# Patient Record
Sex: Female | Born: 1985 | Race: White | Hispanic: No | Marital: Married | Smoking: Never smoker
Health system: Southern US, Community
[De-identification: ages and names within clinical notes are randomized; demographics above are authoritative.]

## PROBLEM LIST (undated history)

## (undated) DIAGNOSIS — F419 Anxiety disorder, unspecified: Secondary | ICD-10-CM

## (undated) DIAGNOSIS — F988 Other specified behavioral and emotional disorders with onset usually occurring in childhood and adolescence: Secondary | ICD-10-CM

---

## 2020-05-09 ENCOUNTER — Emergency Department (HOSPITAL_COMMUNITY): Payer: Medicaid Other

## 2020-05-09 ENCOUNTER — Encounter (HOSPITAL_COMMUNITY): Payer: Self-pay

## 2020-05-09 ENCOUNTER — Other Ambulatory Visit: Payer: Self-pay

## 2020-05-09 ENCOUNTER — Emergency Department (HOSPITAL_COMMUNITY)
Admission: EM | Admit: 2020-05-09 | Discharge: 2020-05-09 | Disposition: A | Discharge status: Home / Self Care | Payer: Medicaid Other | Attending: Emergency Medicine | Admitting: Emergency Medicine

## 2020-05-09 DIAGNOSIS — R0789 Other chest pain: Secondary | ICD-10-CM | POA: Diagnosis not present

## 2020-05-09 DIAGNOSIS — R0602 Shortness of breath: Secondary | ICD-10-CM | POA: Insufficient documentation

## 2020-05-09 DIAGNOSIS — Z79899 Other long term (current) drug therapy: Secondary | ICD-10-CM | POA: Diagnosis not present

## 2020-05-09 DIAGNOSIS — R079 Chest pain, unspecified: Secondary | ICD-10-CM

## 2020-05-09 LAB — D-DIMER, QUANTITATIVE: D-Dimer, Quant: 0.52 ug/mL-FEU — ABNORMAL HIGH (ref 0.00–0.50)

## 2020-05-09 LAB — CBC
HCT: 43.8 % (ref 36.0–46.0)
Hemoglobin: 14.2 g/dL (ref 12.0–15.0)
MCH: 29.5 pg (ref 26.0–34.0)
MCHC: 32.4 g/dL (ref 30.0–36.0)
MCV: 91.1 fL (ref 80.0–100.0)
Platelets: 333 10*3/uL (ref 150–400)
RBC: 4.81 MIL/uL (ref 3.87–5.11)
RDW: 12.3 % (ref 11.5–15.5)
WBC: 9.5 10*3/uL (ref 4.0–10.5)
nRBC: 0 % (ref 0.0–0.2)

## 2020-05-09 LAB — BASIC METABOLIC PANEL
Anion gap: 8 (ref 5–15)
BUN: 12 mg/dL (ref 6–20)
CO2: 24 mmol/L (ref 22–32)
Calcium: 9 mg/dL (ref 8.9–10.3)
Chloride: 105 mmol/L (ref 98–111)
Creatinine, Ser: 0.85 mg/dL (ref 0.44–1.00)
GFR calc Af Amer: >60 mL/min (ref >60)
GFR calc non Af Amer: >60 mL/min (ref >60)
Glucose, Bld: 153 mg/dL — ABNORMAL HIGH (ref 70–99)
Potassium: 4 mmol/L (ref 3.5–5.1)
Sodium: 137 mmol/L (ref 135–145)

## 2020-05-09 LAB — I-STAT BETA HCG BLOOD, ED (MC, WL, AP ONLY): I-stat hCG, quantitative: <5 m[IU]/mL (ref <5)

## 2020-05-09 LAB — TROPONIN I (HIGH SENSITIVITY)
Troponin I (High Sensitivity): <2 ng/L (ref <18)
Troponin I (High Sensitivity): 2 ng/L (ref <18)

## 2020-05-09 MED ORDER — ALBUTEROL SULFATE HFA 108 (90 BASE) MCG/ACT IN AERS
2.0000 | INHALATION_SPRAY | Freq: Once | RESPIRATORY_TRACT | Status: AC
  Administered 2020-05-09: 2 via RESPIRATORY_TRACT
  Filled 2020-05-09: qty 6.7

## 2020-05-09 MED ORDER — SODIUM CHLORIDE 0.9% FLUSH: 3.0000 mL | Freq: Once | INTRAVENOUS | Status: DC

## 2020-05-09 MED ORDER — IOHEXOL 350 MG/ML SOLN
100.0000 mL | Freq: Once | INTRAVENOUS | Status: AC | PRN
  Administered 2020-05-09: 54 mL via INTRAVENOUS

## 2020-05-09 MED ORDER — LORAZEPAM 1 MG PO TABS
1.0000 mg | ORAL_TABLET | Freq: Once | ORAL | Status: AC
  Administered 2020-05-09: 1 mg via ORAL
  Filled 2020-05-09: qty 1

## 2020-05-09 MED ORDER — AEROCHAMBER PLUS FLO-VU LARGE MISC
1.0000 | Freq: Once | Status: AC
  Administered 2020-05-09: 1

## 2020-05-09 NOTE — Discharge Instructions (Addendum)
Your work-up today has been reassuring, please continue to use albuterol and other medications prescribed by your primary care doctor as needed for shortness of breath.  Please call to schedule follow-up with your lung doctor to schedule pulmonary function test.  I would also like you to call to schedule follow-up with your cardiologist so they can see if they think there is any heart problem contributing to your symptoms.

## 2020-05-09 NOTE — ED Notes (Signed)
Pt transported to CT ?

## 2020-05-09 NOTE — ED Notes (Signed)
Patient is back in bed on the monitor with family at bedside and call bell in reach  

## 2020-05-09 NOTE — ED Triage Notes (Addendum)
Pt arrives to ED w/ c/o chest pain, sob x 1 month. Pt endorses sob on exertion. Pt also states she has developed a rash over the last week.

## 2020-05-09 NOTE — ED Notes (Signed)
RT at bedside.

## 2020-05-09 NOTE — ED Provider Notes (Signed)
[facility] EMERGENCY DEPARTMENT Provider Note   CSN: [id number] Arrival date & time: 05/09/20  1205     History Chief Complaint  Patient presents with  . Chest Pain    Flora Parks is a 34 y.o. female.  Zemirah Krasinski Oplinger is a 34 y.o. female with a history of ADHD, generalized anxiety disorder, hyperlipidemia, GERD, DVT, scoliosis, who presents to the emergency department for evaluation of shortness of breath and chest pain.   Patient reports she has been dealing with shortness of breath for a little over 1 month, and has seen her PCP multiple times regarding this, but they have not been able to find a cause.  She reports over the past week her symptoms have been getting worse she describes associated central chest pain that gets worse when her breathing is worse.  She states shortness of breath is worse with exertion and has been limiting her activity and ability to go to work.  She also reports a dry intermittent nonproductive cough.  No fevers or chills.  No lower extremity swelling.  She does have a history of DVT.  States her PCP and a CT scan to evaluate for blood clot in her lung when symptoms first began which was negative, but she now reports that shortness of breath and chest pain are worsening.  Her PCP felt that this may be related to reactive airway disease and starting her on an inhaler, but she feels that she does not know how to use correctly and is not getting much benefit from it.  She has been seen by a pulmonologist, but they have not done any testing yet, she states they were supposed to do pulmonary function test but it has been taking a long time to get the schedule.  Patient reports that she is extremely frustrated and just wants to know why she is feeling so short of breath.  Denies any lower extremity swelling.  No personal history of CAD or heart failure.  She does report that she has had issues with an irregular  heartbeat ever since she was a young child, that she seen a cardiologist for.  Reports very occasional cigarette use, none since shortness of breath began.     History reviewed. No pertinent past medical history.  There are no problems to display for this patient.   History reviewed. No pertinent surgical history.   OB History   No obstetric history on file.     No family history on file.  Social History   Tobacco Use  . Smoking status: Not on file  Substance Use Topics  . Alcohol use: Not on file  . Drug use: Not on file    Home Medications Prior to Admission medications   Medication Sig Start Date End Date Taking? Authorizing Provider  albuterol (ACCUNEB) 1.25 MG/3ML nebulizer solution Take 1 ampule by nebulization every 6 (six) hours as needed for wheezing or shortness of breath.  01/17/20  Yes [provider]  albuterol (VENTOLIN HFA) 108 (90 Base) MCG/ACT inhaler Inhale 2 puffs into the lungs every 4 (four) hours as needed for wheezing or shortness of breath.  01/13/20  Yes [provider]  baclofen (LIORESAL) 10 MG tablet Take 10 mg by mouth 3 (three) times daily as needed for muscle spasms. 01/23/20  Yes [provider]  Cholecalciferol 50 MCG (2000 UT) TABS Take 2,000 Units by mouth daily. 08/27/19 08/26/20 Yes [provider]  Dexlansoprazole 30 MG capsule Take 30  capsules by mouth in the morning and at bedtime. 02/04/20 02/03/21 Yes [provider]  diphenhydrAMINE (BENADRYL) 25 mg capsule Take 25 mg by mouth at bedtime as needed for itching.   Yes [provider]  lisdexamfetamine (VYVANSE) 40 MG capsule Take 40 mg by mouth daily. 04/01/20 05/02/21 Yes [provider]  loratadine (CLARITIN) 10 MG tablet Take 10 mg by mouth daily.   Yes [provider]    Allergies    Hydroxyzine, Hydrochlorothiazide, Topiramate, Azithromycin, Atomoxetine, Other, and Pantoprazole  Review of Systems   Review of Systems    Constitutional: Negative for chills and fever.  HENT: Negative.   Respiratory: Positive for cough and shortness of breath. Negative for chest tightness.   Cardiovascular: Positive for chest pain and palpitations (Chronic, unchanged). Negative for leg swelling.  Gastrointestinal: Negative for abdominal pain, nausea and vomiting.  Musculoskeletal: Negative for arthralgias and myalgias.  Skin: Negative for color change.  Neurological: Negative for dizziness, syncope and light-headedness.  Psychiatric/Behavioral: The patient is nervous/anxious.   All other systems reviewed and are negative.   Physical Exam Updated Vital Signs BP (!) 132/93   Pulse (!) 103   Temp 97.7 F (36.5 C) (Oral)   Resp 19   Ht 5\' 3"  (1.6 m)   Wt 70.8 kg   SpO2 96%   BMI 27.63 kg/m   Physical Exam Vitals and nursing note reviewed.  Constitutional:      General: She is not in acute distress.    Appearance: She is well-developed. She is not diaphoretic.     Comments: Appears anxious, but is well-appearing and in no distress  HENT:     Head: Normocephalic and atraumatic.  Eyes:     General:        Right eye: No discharge.        Left eye: No discharge.     Pupils: Pupils are equal, round, and reactive to light.  Cardiovascular:     Rate and Rhythm: Normal rate and regular rhythm.     Pulses:          Radial pulses are 2+ on the right side and 2+ on the left side.     Heart sounds: Normal heart sounds. No murmur heard.  No friction rub. No gallop.      Comments: Intermittent mild tachycardia with regular rhythm Pulmonary:     Effort: Pulmonary effort is normal. No respiratory distress.     Breath sounds: Normal breath sounds. No wheezing or rales.     Comments: Respirations equal and unlabored, patient able to speak in full sentences, lungs clear to auscultation bilaterally Chest:     Chest wall: No tenderness.  Abdominal:     General: Bowel sounds are normal. There is no distension.      Palpations: Abdomen is soft. There is no mass.     Tenderness: There is no abdominal tenderness. There is no guarding.     Comments: Abdomen soft, nondistended, nontender to palpation in all quadrants without guarding or peritoneal signs  Musculoskeletal:        General: No deformity.     Cervical back: Neck supple.     Right lower leg: No edema.     Left lower leg: No edema.  Skin:    General: Skin is warm and dry.     Capillary Refill: Capillary refill takes less than 2 seconds.  Neurological:     Mental Status: She is alert.     Coordination: Coordination  normal.     Comments: Speech is clear, able to follow commands Moves extremities without ataxia, coordination intact  Psychiatric:        Mood and Affect: Mood is anxious.     ED Results / Procedures / Treatments   Labs (all labs ordered are listed, but only abnormal results are displayed) Labs Reviewed  BASIC METABOLIC PANEL - Abnormal; Notable for the following components:      Result Value   Glucose, Bld 153 (*)    All other components within normal limits  D-DIMER, QUANTITATIVE (NOT AT [facility]) - Abnormal; Notable for the following components:   D-Dimer, Quant 0.52 (*)    All other components within normal limits  CBC  I-STAT BETA HCG BLOOD, ED ([facility], [facility], [facility] ONLY)  TROPONIN I (HIGH SENSITIVITY)  TROPONIN I (HIGH SENSITIVITY)    EKG EKG Interpretation  Date/Time:  Saturday May 09 2020 12:11:34 EDT Ventricular Rate:  94 PR Interval:  124 QRS Duration: 68 QT Interval:  318 QTC Calculation: 397 R Axis:   56 Text Interpretation: Normal sinus rhythm with sinus arrhythmia Normal ECG No acute changes No old tracing to compare Confirmed by Derwood Kaplananavati, Ankit [id number]) on 05/09/2020 2:47:37 PM   Radiology DG Chest 2 View  Result Date: 05/09/2020 CLINICAL DATA:  Chest pain and shortness of breath EXAM: CHEST - 2 VIEW COMPARISON:  None. FINDINGS: Lungs are clear. Heart size and pulmonary vascularity are normal. No adenopathy.  There is midthoracic dextroscoliosis. No pneumothorax. IMPRESSION: Lungs clear.  Cardiac silhouette normal. Electronically Signed   By: Bretta BangWilliam  Woodruff III M.D.   On: 05/09/2020 13:38   CT Angio Chest PE W and/or Wo Contrast  Result Date: 05/09/2020 CLINICAL DATA:  Chest pain and shortness of breath for the past month. Elevated D-dimer. EXAM: CT ANGIOGRAPHY CHEST WITH CONTRAST TECHNIQUE: Multidetector CT imaging of the chest was performed using the standard protocol during bolus administration of intravenous contrast. Multiplanar CT image reconstructions and MIPs were obtained to evaluate the vascular anatomy. CONTRAST:  54mL OMNIPAQUE IOHEXOL 350 MG/ML SOLN COMPARISON:  None. FINDINGS: Cardiovascular: Satisfactory opacification of the pulmonary arteries to the segmental level. No evidence of pulmonary embolism. Normal heart size. No pericardial effusion. No thoracic aortic aneurysm or dissection. Mediastinum/Nodes: No enlarged mediastinal, hilar, or axillary lymph nodes. Thyroid gland, trachea, and esophagus demonstrate no significant findings. Lungs/Pleura: Lungs are clear. No pleural effusion or pneumothorax. Upper Abdomen: No acute abnormality. Musculoskeletal: No chest wall abnormality. No acute or significant osseous findings. Review of the MIP images confirms the above findings. IMPRESSION: 1. Normal CTA of the chest. No evidence of pulmonary embolism. Electronically Signed   By: Obie DredgeWilliam T Derry M.D.   On: 05/09/2020 17:29    Procedures Procedures (including critical care time)  Medications Ordered in ED Medications  sodium chloride flush (NS) 0.9 % injection 3 mL (has no administration in time range)  albuterol (VENTOLIN HFA) 108 (90 Base) MCG/ACT inhaler 2 puff (2 puffs Inhalation Given 05/09/20 1429)  AeroChamber Plus Flo-Vu Large MISC 1 each (1 each Other Given 05/09/20 1446)  LORazepam (ATIVAN) tablet 1 mg (1 mg Oral Given 05/09/20 1444)  iohexol (OMNIPAQUE) 350 MG/ML injection 100 mL (54  mLs Intravenous Contrast Given 05/09/20 1713)    ED Course  I have reviewed the triage vital signs and the nursing notes.  Pertinent labs & imaging results that were available during my care of the patient were reviewed by me and considered in my medical decision making (see chart for details).  MDM Rules/Calculators/A&P                         34 year old female presents for evaluation of shortness of breath, symptoms have been present for a little over 1 month, she has seen her PCP multiple times regarding this, her.  Patient and previous notes from PCP for further information.  There is concerned that she may have underlying reactive airway disease, she has been seen by pulmonologist once but has not had any further testing done with them, had negative CTA when symptoms had just began.  She reports that over the past week her breathing has worsened and she is having central chest pain that seems to be worse as shortness of breath worsens.  Shortness of breath is starting to impact her ability to go to work.  She has been trying to use inhaler but feels that she is not using it correctly and not getting much benefit.  Chart review also notes a history patient has not been medications correctly she does not feel that they help symptoms.  We will further evaluate with labs, EKG, chest x-ray, troponin, given worsening symptoms and tachycardia on arrival will repeat D-dimer to reevaluate for PE.  Patient currently feeling short of breath, but maintaining normal O2 sats, will give albuterol and have prepared to provide education and will also try a small dose of Ativan.  Patient wanting to have pulmonary function test done but had long discussion with patient and husband that these are not able to be done through the emergency department.  I have independently ordered, reviewed and interpreted all labs and imaging: EKG - Normal sinus w/o arrhythmia or ischemic changes CXR - No active cardiopulmonary  disease CBC - no leukocytosis, Hgb WNL BMP - No acute electrolyte derangements, normal renal function Preg - neg Trop - neg x 2 D-dimer - 0.52, slightly elevated, will proceed with CTA  CTA without evidence of PE or other acute abnormalities.  Pt ambulated in department and maintained O2 sats of 97-100%, did complain of feeling SOB.  Had long discussion with patient and her husband regarding work-up today.  She is frustrated that she has been dealing with the shortness of breath for more than a month now without being able to identify a cause.  We discussed the potential causes that we have ruled out today, as well as continued symptomatic treatment with bronchodilators, patient provided education from respiratory therapy on use with spacer.  I have also encouraged the patient to try and take her anxiety medication regularly to help eliminate this variable as a potential cause for her symptoms.  I have encouraged her to call to schedule follow-up with the pulmonologist she seen so that she can have pulmonary function test performed.  At this time there does not appear to be any evidence of an acute emergency medical condition and the patient appears stable for discharge with appropriate outpatient follow up.Diagnosis was discussed with patient who verbalizes understanding and is agreeable to discharge.  BP (!) 132/93   Pulse 97   Temp 97.8 F (36.6 C) (Oral)   Resp 19   Ht 5\' 3"  (1.6 m)   Wt 70.8 kg   SpO2 96%   BMI 27.63 kg/m   Portions of this note were generated with Lobby[employer]. Dictation errors may occur despite best attempts at proofreading.   Final Clinical Impression(s) / ED Diagnoses Final diagnoses:  Shortness of breath  Central chest pain  Rx / DC Orders ED Discharge Orders    None       Legrand Rams 05/11/20 Felicita Gage, MD 05/12/20 [phone]

## 2020-05-09 NOTE — Progress Notes (Signed)
RT note: RT presented asthma education and proper use of MDI and HHN devices. RT used teach back method with patient and family member demonstrated understanding and proper use.

## 2020-05-09 NOTE — ED Notes (Signed)
Walked patient to the bathroom patient did well 

## 2020-05-25 ENCOUNTER — Other Ambulatory Visit: Payer: Self-pay

## 2020-05-25 ENCOUNTER — Ambulatory Visit (HOSPITAL_COMMUNITY)
Admission: EM | Admit: 2020-05-25 | Discharge: 2020-05-25 | Disposition: A | Discharge status: Home / Self Care | Payer: Medicaid Other

## 2020-05-25 NOTE — ED Notes (Signed)
Charlene Hill, patient access reports patient left, patient complained to her about wait time

## 2020-09-06 ENCOUNTER — Ambulatory Visit
Admission: EM | Admit: 2020-09-06 | Discharge: 2020-09-06 | Disposition: A | Discharge status: Home / Self Care | Payer: Medicaid Other | Attending: Emergency Medicine | Admitting: Emergency Medicine

## 2020-09-06 ENCOUNTER — Encounter: Payer: Self-pay | Admitting: Emergency Medicine

## 2020-09-06 ENCOUNTER — Other Ambulatory Visit: Payer: Self-pay

## 2020-09-06 DIAGNOSIS — N898 Other specified noninflammatory disorders of vagina: Secondary | ICD-10-CM | POA: Diagnosis not present

## 2020-09-06 DIAGNOSIS — H9193 Unspecified hearing loss, bilateral: Secondary | ICD-10-CM | POA: Diagnosis not present

## 2020-09-06 DIAGNOSIS — R300 Dysuria: Secondary | ICD-10-CM | POA: Diagnosis not present

## 2020-09-06 HISTORY — DX: Anxiety disorder, unspecified: F41.9

## 2020-09-06 HISTORY — DX: Other specified behavioral and emotional disorders with onset usually occurring in childhood and adolescence: F98.8

## 2020-09-06 LAB — POCT URINALYSIS DIP (MANUAL ENTRY)
Glucose, UA: NEGATIVE mg/dL
Ketones, POC UA: NEGATIVE mg/dL
Leukocytes, UA: NEGATIVE
Nitrite, UA: NEGATIVE
Protein Ur, POC: NEGATIVE mg/dL
Spec Grav, UA: >=1.03 — AB (ref 1.010–1.025)
Urobilinogen, UA: 0.2 E.U./dL
pH, UA: 6 (ref 5.0–8.0)

## 2020-09-06 MED ORDER — FEXOFENADINE HCL 60 MG PO TABS: 60.0000 mg | ORAL_TABLET | Freq: Two times a day (BID) | ORAL | 0 refills | Status: AC

## 2020-09-06 MED ORDER — FLUTICASONE PROPIONATE 50 MCG/ACT NA SUSP
1.0000 | Freq: Every day | NASAL | 0 refills | Status: AC
Start: 2020-09-06 — End: ?

## 2020-09-06 MED ORDER — FLUCONAZOLE 150 MG PO TABS: 150.0000 mg | ORAL_TABLET | Freq: Every day | ORAL | 0 refills | Status: AC

## 2020-09-06 NOTE — Discharge Instructions (Addendum)
Diflucan once today, 2nd dose in 3 days. Allergy tablet and flonase daily. Keep appointment with PCP.

## 2020-09-06 NOTE — ED Triage Notes (Signed)
Pt sts ear fullness x weeks since having covid; pt also sts dysuria and some vaginal discharge; pt hasnt been on any meds due to getting new PCP

## 2020-09-06 NOTE — ED Provider Notes (Signed)
[facility]    CSN: [id number] Arrival date & time: 09/06/20  1008      History   Chief Complaint Chief Complaint  Patient presents with  . Ear Fullness  . Dysuria    HPI Charlene Hill is a 34 y.o. female  Presenting for bilateral ear fullness with muffled hearing since having Covid approximately 1 month ago.  Patient denies tinnitus, dizziness, fever.  Does have history of allergies, though is not taking thing for this.  Denies nasal congestion or pressure, sore throat. Patient also concern for yeast infection as she has had some discomfort urinating with increased vaginal discharge.  Denies vaginal or pelvic pain, concern for STI.  No anogenital rash.  Denies urinary frequency, urgency, back pain or abdominal pain.  Past Medical History:  Diagnosis Date  . ADD (attention deficit disorder)   . Anxiety     There are no problems to display for this patient.   History reviewed. No pertinent surgical history.  OB History   No obstetric history on file.      Home Medications    Prior to Admission medications   Medication Sig Start Date End Date Taking? Authorizing Provider  albuterol (ACCUNEB) 1.25 MG/3ML nebulizer solution Take 1 ampule by nebulization every 6 (six) hours as needed for wheezing or shortness of breath.  01/17/20   [provider]  albuterol (VENTOLIN HFA) 108 (90 Base) MCG/ACT inhaler Inhale 2 puffs into the lungs every 4 (four) hours as needed for wheezing or shortness of breath.  01/13/20   [provider]  baclofen (LIORESAL) 10 MG tablet Take 10 mg by mouth 3 (three) times daily as needed for muscle spasms. Patient not taking: Reported on 09/06/2020 01/23/20   [provider]  Dexlansoprazole 30 MG capsule Take 30 capsules by mouth in the morning and at bedtime. 02/04/20 02/03/21  [provider]  diphenhydrAMINE (BENADRYL) 25 mg capsule Take 25 mg by mouth at bedtime as needed for itching.     [provider]  fexofenadine (ALLEGRA) 60 MG tablet Take 1 tablet (60 mg total) by mouth 2 (two) times daily. 09/06/20 10/06/20  Hall-Potvin, [place], PA-C  fluconazole (DIFLUCAN) 150 MG tablet Take 1 tablet (150 mg total) by mouth daily. May repeat in 72 hours if needed 09/06/20   Hall-Potvin, [place], PA-C  fluticasone (FLONASE) 50 MCG/ACT nasal spray Place 1 spray into both nostrils daily. 09/06/20   Hall-Potvin, [place], PA-C  lisdexamfetamine (VYVANSE) 40 MG capsule Take 40 mg by mouth daily. Patient not taking: Reported on 09/06/2020 04/01/20 05/02/21  [provider]  loratadine (CLARITIN) 10 MG tablet Take 10 mg by mouth daily.    [provider]    Family History Family History  Problem Relation Age of Onset  . Diabetes Mother   . Hypertension Mother   . Heart failure Mother     Social History Social History   Tobacco Use  . Smoking status: Never Smoker  . Smokeless tobacco: Never Used  Substance Use Topics  . Alcohol use: Not Currently  . Drug use: Never     Allergies   Hydroxyzine, Hydrochlorothiazide, Topiramate, Azithromycin, Atomoxetine, Other, and Pantoprazole   Review of Systems As per HPI   Physical Exam Triage Vital Signs ED Triage Vitals  Enc Vitals Group     BP      Pulse      Resp      Temp      Temp src  SpO2      Weight      Height      Head Circumference      Peak Flow      Pain Score      Pain Loc      Pain Edu?      Excl. in GC?    No data found.  Updated Vital Signs BP 129/88 (BP Location: Left Arm)   Pulse 92   Temp 98.5 F (36.9 C) (Oral)   Resp 18   SpO2 99%   Visual Acuity Right Eye Distance:   Left Eye Distance:   Bilateral Distance:    Right Eye Near:   Left Eye Near:    Bilateral Near:     Physical Exam Constitutional:      General: She is not in acute distress.    Appearance: She is not ill-appearing.  HENT:     Head: Normocephalic and atraumatic.     Right Ear: Tympanic  membrane and ear canal normal.     Left Ear: Tympanic membrane and ear canal normal.  Eyes:     General: No scleral icterus.    Pupils: Pupils are equal, round, and reactive to light.  Cardiovascular:     Rate and Rhythm: Normal rate.  Pulmonary:     Effort: Pulmonary effort is normal.  Skin:    Coloration: Skin is not jaundiced or pale.  Neurological:     Mental Status: She is alert and oriented to person, place, and time.      UC Treatments / Results  Labs (all labs ordered are listed, but only abnormal results are displayed) Labs Reviewed  POCT URINALYSIS DIP (MANUAL ENTRY) - Abnormal; Notable for the following components:      Result Value   Clarity, UA hazy (*)    Bilirubin, UA small (*)    Spec Grav, UA >=1.030 (*)    Blood, UA moderate (*)    All other components within normal limits    EKG   Radiology No results found.  Procedures Procedures (including critical care time)  Medications Ordered in UC Medications - No data to display  Initial Impression / Assessment and Plan / UC Course  I have reviewed the triage vital signs and the nursing notes.  Pertinent labs & imaging results that were available during my care of the patient were reviewed by me and considered in my medical decision making (see chart for details).     Urine with moderate blood, elevated specific gravity.  Low concern for UTI at this time.  Will follow up with PCP in that regard as needed.  Will trial antihistamines, intranasal steroid for possible eustachian tube dysfunction.  No alarm signs/symptoms as in HPI, PE.  Provided ENT contact information for further evaluation as needed.  Return precautions discussed, pt verbalized understanding and is agreeable to plan. Final Clinical Impressions(s) / UC Diagnoses   Final diagnoses:  Dysuria  Vaginal discharge  Decreased hearing of both ears     Discharge Instructions     Diflucan once today, 2nd dose in 3 days. Allergy tablet and  flonase daily. Keep appointment with PCP.    ED Prescriptions    Medication Sig Dispense Auth. Provider   fluticasone (FLONASE) 50 MCG/ACT nasal spray Place 1 spray into both nostrils daily. 16 g Hall-Potvin, [place], PA-C   fexofenadine (ALLEGRA) 60 MG tablet Take 1 tablet (60 mg total) by mouth 2 (two) times daily. 60 tablet Hall-Potvin, [place], [place]  fluconazole (DIFLUCAN) 150 MG tablet Take 1 tablet (150 mg total) by mouth daily. May repeat in 72 hours if needed 2 tablet Hall-Potvin, [place], PA-C     PDMP not reviewed this encounter.   Hall-Potvin, [place], [place] 09/06/20 1124

## 2021-01-26 ENCOUNTER — Other Ambulatory Visit: Payer: Self-pay

## 2021-01-26 ENCOUNTER — Ambulatory Visit
Admission: EM | Admit: 2021-01-26 | Discharge: 2021-01-26 | Disposition: A | Discharge status: Home / Self Care | Payer: Medicaid Other | Attending: Urgent Care | Admitting: Urgent Care

## 2021-01-26 ENCOUNTER — Encounter: Payer: Self-pay | Admitting: Emergency Medicine

## 2021-01-26 DIAGNOSIS — R42 Dizziness and giddiness: Secondary | ICD-10-CM

## 2021-01-26 DIAGNOSIS — R350 Frequency of micturition: Secondary | ICD-10-CM | POA: Diagnosis present

## 2021-01-26 DIAGNOSIS — R3915 Urgency of urination: Secondary | ICD-10-CM

## 2021-01-26 DIAGNOSIS — R0981 Nasal congestion: Secondary | ICD-10-CM | POA: Diagnosis present

## 2021-01-26 DIAGNOSIS — R059 Cough, unspecified: Secondary | ICD-10-CM

## 2021-01-26 LAB — POCT URINALYSIS DIP (MANUAL ENTRY)
Bilirubin, UA: NEGATIVE
Blood, UA: NEGATIVE
Glucose, UA: NEGATIVE mg/dL
Ketones, POC UA: NEGATIVE mg/dL
Leukocytes, UA: NEGATIVE
Nitrite, UA: NEGATIVE
Protein Ur, POC: NEGATIVE mg/dL
Spec Grav, UA: 1.01 (ref 1.010–1.025)
Urobilinogen, UA: 0.2 E.U./dL
pH, UA: 7 (ref 5.0–8.0)

## 2021-01-26 MED ORDER — BENZONATATE 100 MG PO CAPS: 100.0000 mg | ORAL_CAPSULE | Freq: Three times a day (TID) | ORAL | 0 refills | Status: AC | PRN

## 2021-01-26 MED ORDER — PROMETHAZINE-DM 6.25-15 MG/5ML PO SYRP: 5.0000 mL | ORAL_SOLUTION | Freq: Every evening | ORAL | 0 refills | Status: AC | PRN

## 2021-01-26 NOTE — ED Triage Notes (Signed)
Pt said x 3days she has been having some congestion causing cough, with dizziness. Pt also said possible yeast, possible UTI. Burning with urination and urinary frequency.

## 2021-01-26 NOTE — ED Provider Notes (Signed)
[facility]   MRN: [id number] DOB: 16-Feb-1986  Subjective:   Charlene Hill is a 35 y.o. female presenting for 3-day history of acute onset recurrent sinus congestion, cough.  Patient has concerns about a sinus infection or lung infection.  She also has had some intermittent dizziness.  Is worried about a yeast infection or UTI she is having dysuria, urinary frequency.  Patient admits that she does not drink water, drinks coffee and soda.  She has had COVID previously and states that this feels very different and does not want to test today.  No current facility-administered medications for this encounter.  Current Outpatient Medications:  .  albuterol (ACCUNEB) 1.25 MG/3ML nebulizer solution, Take 1 ampule by nebulization every 6 (six) hours as needed for wheezing or shortness of breath. , Disp: , Rfl:  .  albuterol (VENTOLIN HFA) 108 (90 Base) MCG/ACT inhaler, Inhale 2 puffs into the lungs every 4 (four) hours as needed for wheezing or shortness of breath. , Disp: , Rfl:  .  baclofen (LIORESAL) 10 MG tablet, Take 10 mg by mouth 3 (three) times daily as needed for muscle spasms. (Patient not taking: Reported on 09/06/2020), Disp: , Rfl:  .  Dexlansoprazole 30 MG capsule, Take 30 capsules by mouth in the morning and at bedtime., Disp: , Rfl:  .  diphenhydrAMINE (BENADRYL) 25 mg capsule, Take 25 mg by mouth at bedtime as needed for itching., Disp: , Rfl:  .  fexofenadine (ALLEGRA) 60 MG tablet, Take 1 tablet (60 mg total) by mouth 2 (two) times daily., Disp: 60 tablet, Rfl: 0 .  fluconazole (DIFLUCAN) 150 MG tablet, Take 1 tablet (150 mg total) by mouth daily. May repeat in 72 hours if needed, Disp: 2 tablet, Rfl: 0 .  fluticasone (FLONASE) 50 MCG/ACT nasal spray, Place 1 spray into both nostrils daily., Disp: 16 g, Rfl: 0 .  lisdexamfetamine (VYVANSE) 40 MG capsule, Take 40 mg by mouth daily. (Patient not taking: Reported on 09/06/2020), Disp: , Rfl:  .   loratadine (CLARITIN) 10 MG tablet, Take 10 mg by mouth daily., Disp: , Rfl:    Allergies  Allergen Reactions  . Hydroxyzine Other (See Comments)    Pt states she cannot take this medication with her other medications  Pt states she cannot take this medication with her other medications    . Hydrochlorothiazide Rash  . Topiramate Other (See Comments)    Numbness,tingling in hands Numbness,tingling in hands Numbness,tingling in hands   . Azithromycin Other (See Comments)    Stomach cramps Stomach cramps Stomach cramps   . Atomoxetine Nausea Only    Stomach cramps  . Other Diarrhea  . Pantoprazole Diarrhea    Past Medical History:  Diagnosis Date  . ADD (attention deficit disorder)   . Anxiety      History reviewed. No pertinent surgical history.  Family History  Problem Relation Age of Onset  . Diabetes Mother   . Hypertension Mother   . Heart failure Mother     Social History   Tobacco Use  . Smoking status: Never Smoker  . Smokeless tobacco: Never Used  Substance Use Topics  . Alcohol use: Not Currently  . Drug use: Never    ROS   Objective:   Vitals: BP 130/90 (BP Location: Right Arm)   Pulse (!) 113   Temp 98.1 F (36.7 C) (Oral)   Resp 16   SpO2 99%   Wt Readings from Last 3 Encounters:  05/09/20 156 lb (70.8  kg)   Temp Readings from Last 3 Encounters:  01/26/21 98.1 F (36.7 C) (Oral)  09/06/20 98.5 F (36.9 C) (Oral)  05/09/20 97.8 F (36.6 C) (Oral)   BP Readings from Last 3 Encounters:  01/26/21 130/90  09/06/20 129/88  05/09/20 (!) 132/93   Pulse Readings from Last 3 Encounters:  01/26/21 (!) 113  09/06/20 92  05/09/20 (!) 103   Physical Exam Constitutional:      General: She is not in acute distress.    Appearance: Normal appearance. She is well-developed. She is not ill-appearing, toxic-appearing or diaphoretic.  HENT:     Head: Normocephalic and atraumatic.     Right Ear: Tympanic membrane, ear canal and external  ear normal. No drainage or tenderness. No middle ear effusion. Tympanic membrane is not erythematous.     Left Ear: Tympanic membrane, ear canal and external ear normal. No drainage or tenderness.  No middle ear effusion. Tympanic membrane is not erythematous.     Nose: Congestion present. No rhinorrhea.     Mouth/Throat:     Mouth: Mucous membranes are moist. No oral lesions.     Pharynx: No pharyngeal swelling, oropharyngeal exudate, posterior oropharyngeal erythema or uvula swelling.     Tonsils: No tonsillar exudate or tonsillar abscesses.  Eyes:     General: No scleral icterus.       Right eye: No discharge.        Left eye: No discharge.     Extraocular Movements: Extraocular movements intact.     Right eye: Normal extraocular motion.     Left eye: Normal extraocular motion.     Conjunctiva/sclera: Conjunctivae normal.     Pupils: Pupils are equal, round, and reactive to light.  Cardiovascular:     Rate and Rhythm: Regular rhythm. Tachycardia present.     Pulses: Normal pulses.     Heart sounds: Normal heart sounds. No murmur heard. No friction rub. No gallop.   Pulmonary:     Effort: Pulmonary effort is normal. No respiratory distress.     Breath sounds: Normal breath sounds. No stridor. No wheezing, rhonchi or rales.  Abdominal:     Tenderness: There is no right CVA tenderness or left CVA tenderness.  Musculoskeletal:     Cervical back: Normal range of motion and neck supple.  Lymphadenopathy:     Cervical: No cervical adenopathy.  Skin:    General: Skin is warm and dry.     Findings: No rash.  Neurological:     General: No focal deficit present.     Mental Status: She is alert and oriented to person, place, and time.     Motor: No weakness.     Coordination: Coordination normal.     Gait: Gait normal.     Deep Tendon Reflexes: Reflexes normal.  Psychiatric:        Mood and Affect: Mood normal.        Behavior: Behavior normal.        Thought Content: Thought  content normal.        Judgment: Judgment normal.     Results for orders placed or performed during the hospital encounter of 01/26/21 (from the past 24 hour(s))  POCT urinalysis dipstick     Status: None   Collection Time: 01/26/21  1:29 PM  Result Value Ref Range   Color, UA yellow yellow   Clarity, UA clear clear   Glucose, UA negative negative mg/dL   Bilirubin, UA negative negative  Ketones, POC UA negative negative mg/dL   Spec Grav, UA 8.144 8.185 - 1.025   Blood, UA negative negative   pH, UA 7.0 5.0 - 8.0   Protein Ur, POC negative negative mg/dL   Urobilinogen, UA 0.2 0.2 or 1.0 E.U./dL   Nitrite, UA Negative Negative   Leukocytes, UA Negative Negative    Assessment and Plan :   PDMP not reviewed this encounter.  1. Cough   2. Nasal congestion   3. Dizziness   4. Urinary frequency   5. Urinary urgency     Patient declined Covid test, cervical swab testing.  Counseled that I suspect she is having a viral URI, viral respiratory infection.  Emphasized need to start hydrating very well with plain water, avoid urinary irritants such as soda and limit coffee.  Urine culture pending.  Recommend supportive care otherwise.  Restart Allegra. Counseled patient on potential for adverse effects with medications prescribed/recommended today, ER and return-to-clinic precautions discussed, patient verbalized understanding.    Wallis Bamberg, PA-C 01/26/21 1352

## 2021-01-29 ENCOUNTER — Telehealth (HOSPITAL_COMMUNITY): Payer: Self-pay | Admitting: Emergency Medicine

## 2021-01-29 LAB — URINE CULTURE: Culture: 20000 — AB

## 2021-01-29 MED ORDER — NITROFURANTOIN MONOHYD MACRO 100 MG PO CAPS: 100.0000 mg | ORAL_CAPSULE | Freq: Two times a day (BID) | ORAL | 0 refills | Status: AC

## 2021-05-18 IMAGING — CT CT ANGIO CHEST
2 of 6 series · 19 of 46 positions shown · IV contrast (omnipaque)
Comparison: None.

CLINICAL DATA: Chest pain and shortness of breath for the past
month. Elevated D-dimer.

EXAM:
CT ANGIOGRAPHY CHEST WITH CONTRAST
TECHNIQUE: Multidetector CT imaging of the chest was performed using the
standard protocol during bolus administration of intravenous
contrast. Multiplanar CT image reconstructions and MIPs were
obtained to evaluate the vascular anatomy.
CONTRAST:  54mL OMNIPAQUE IOHEXOL 350 MG/ML SOLN

[Series 7: thins · axial · 0.54mm/px · z∈[-267,-28]mm · 16 of 375 slices shown]
[im 17/375  lung]
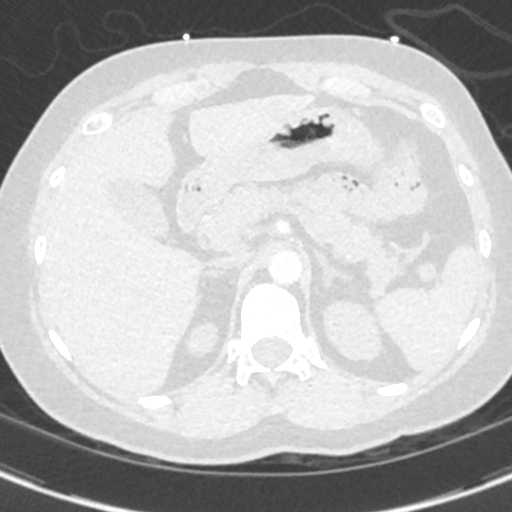
[im 49/375  soft-tissue]
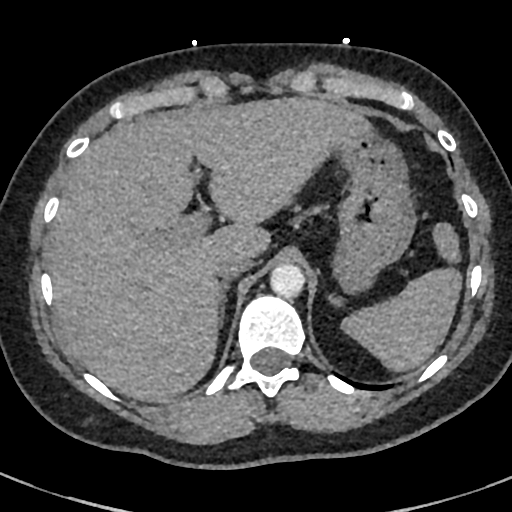
[im 66/375  lung]
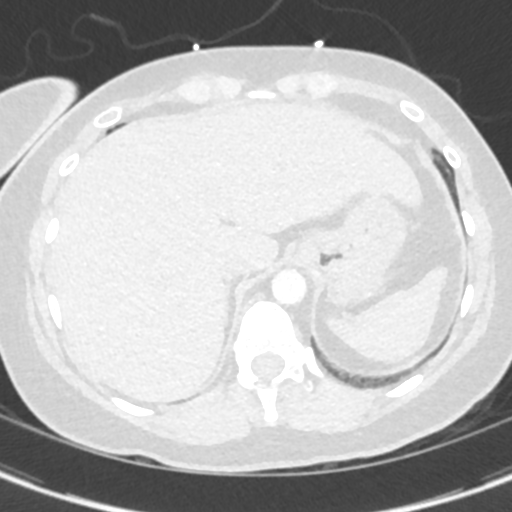
[im 82/375  soft-tissue]
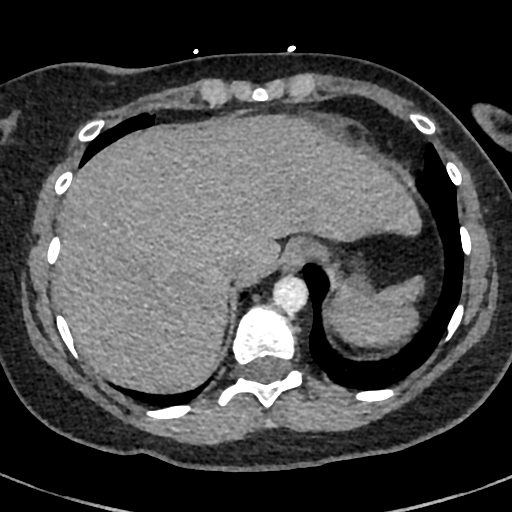
[im 114/375  lung]
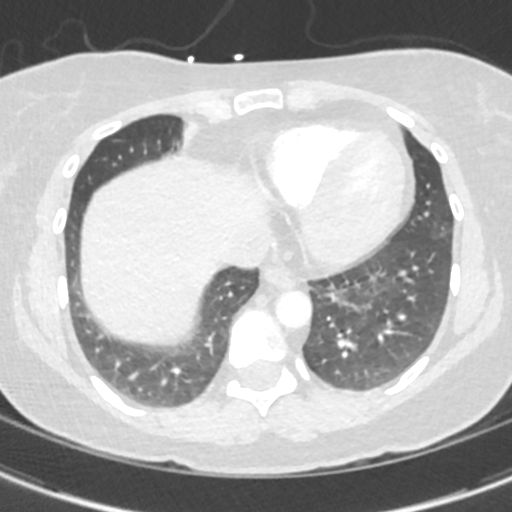
[im 131/375  soft-tissue]
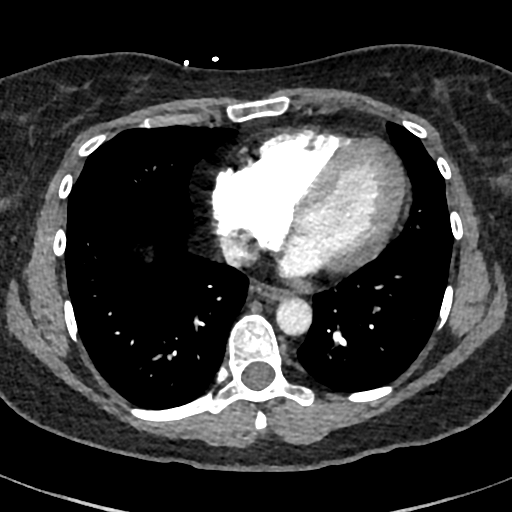
[im 147/375  lung]
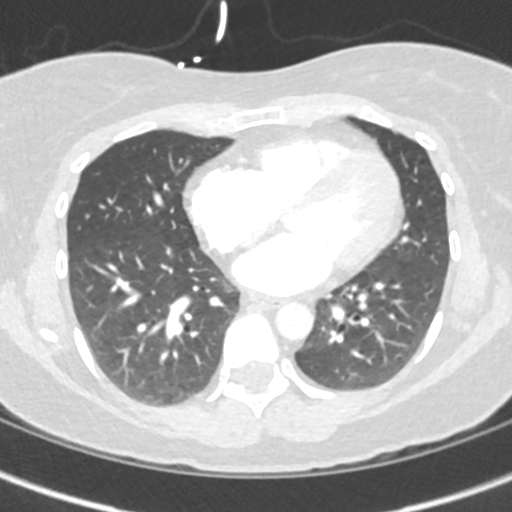
[im 179/375  soft-tissue]
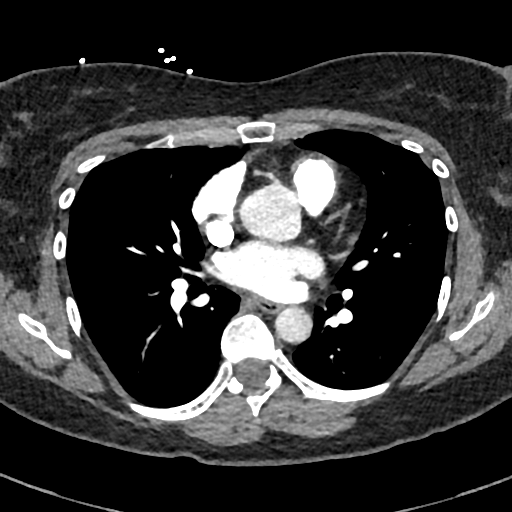
[im 196/375  lung]
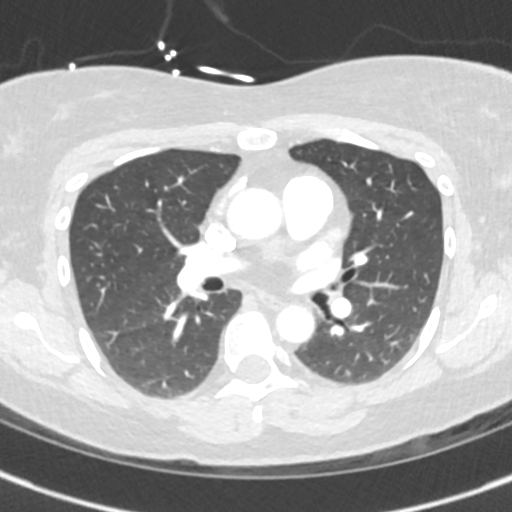
[im 228/375  soft-tissue]
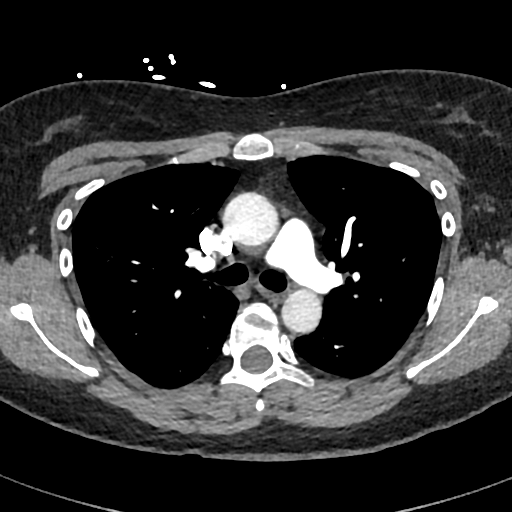
[im 244/375  lung]
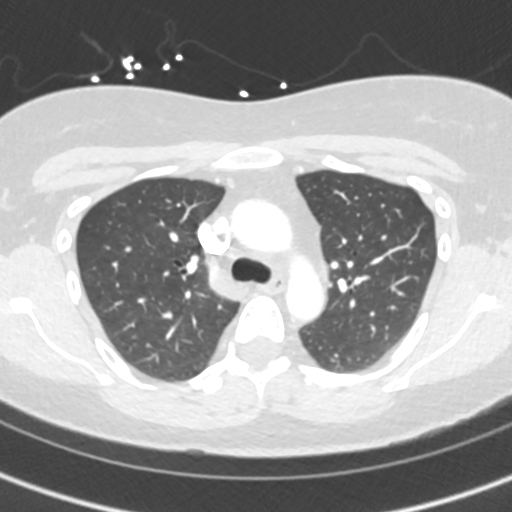
[im 261/375  soft-tissue]
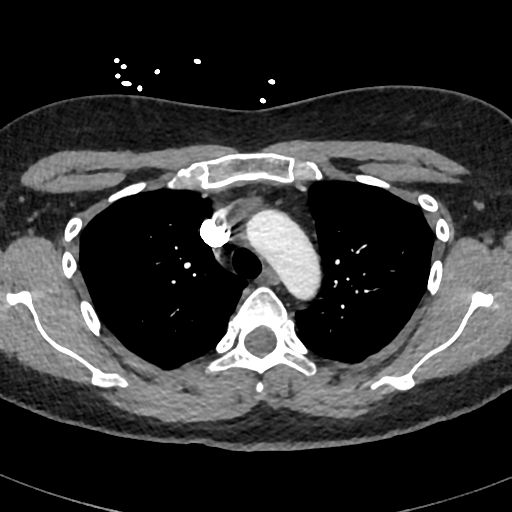
[im 293/375  lung]
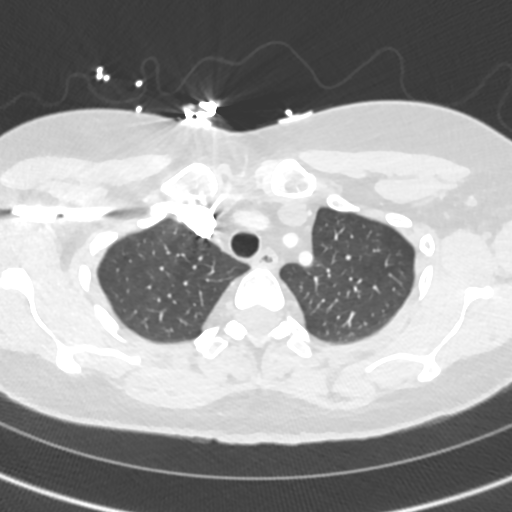
[im 309/375  soft-tissue]
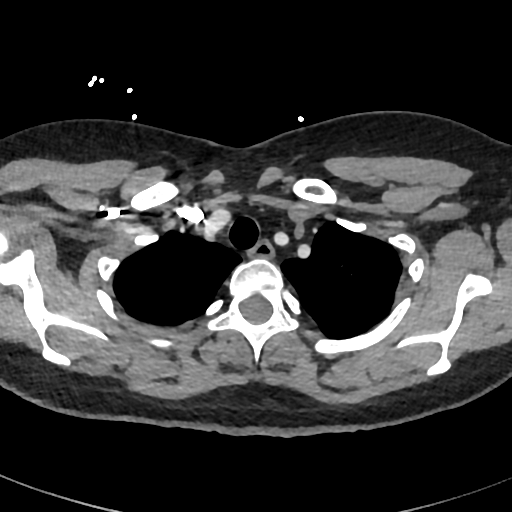
[im 326/375  lung]
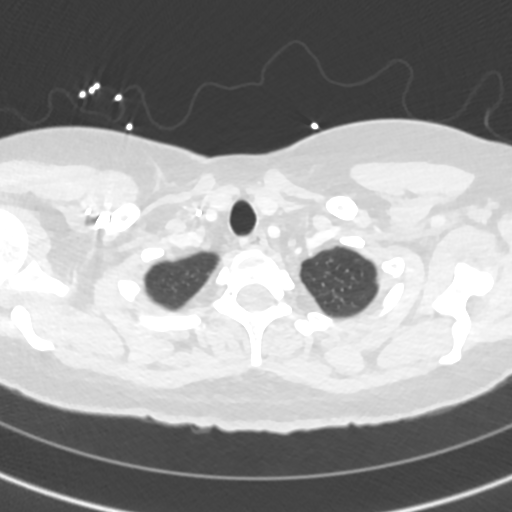
[im 358/375  soft-tissue]
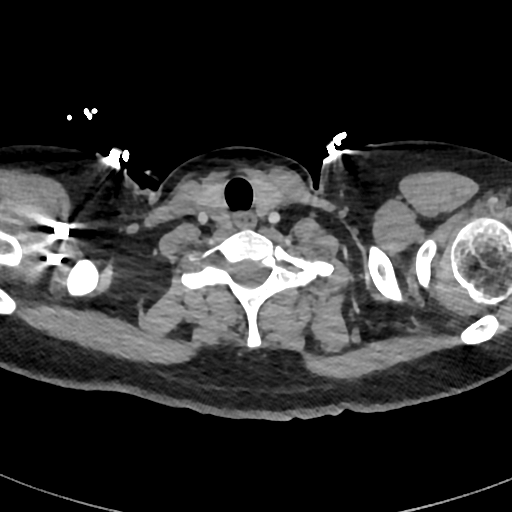

[Series 8: cor · coronal · 0.53mm/px · 3 of 120 slices shown]
[im 30/120  soft-tissue]
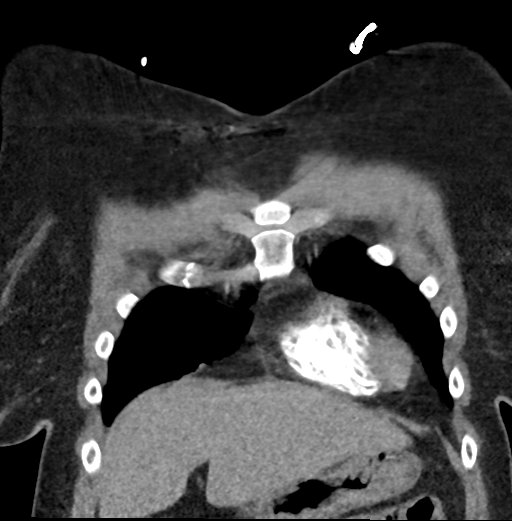
[im 60/120  soft-tissue]
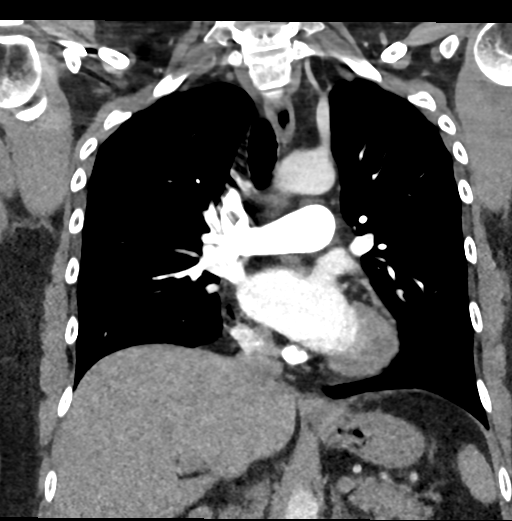
[im 90/120  soft-tissue]
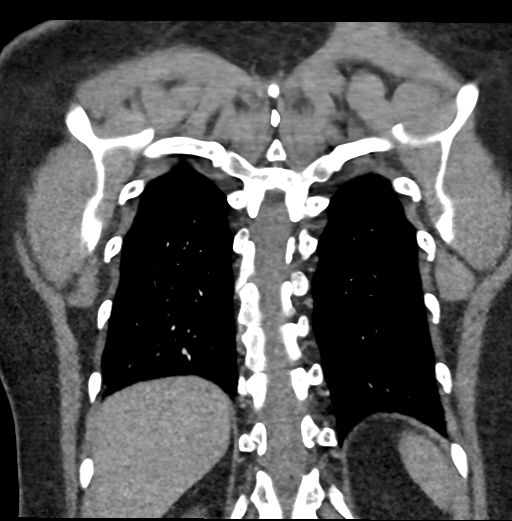

[19 of 46 positions shown; findings below may reference images not displayed]

FINDINGS: Cardiovascular: Satisfactory opacification of the pulmonary arteries
to the segmental level. No evidence of pulmonary embolism. Normal
heart size. No pericardial effusion. No thoracic aortic aneurysm or
dissection.

Mediastinum/Nodes: No enlarged mediastinal, hilar, or axillary lymph
nodes. Thyroid gland, trachea, and esophagus demonstrate no
significant findings.

Lungs/Pleura: Lungs are clear. No pleural effusion or pneumothorax.

Upper Abdomen: No acute abnormality.

Musculoskeletal: No chest wall abnormality. No acute or significant
osseous findings.

Review of the MIP images confirms the above findings.
IMPRESSION: 1. Normal CTA of the chest. No evidence of pulmonary embolism.

## 2024-07-22 NOTE — ED Provider Notes (Signed)
 [facility]  ED Provider Note  Charlene Hill 38 y.o. female DOB: 01/29/1986 MRN: [id number] History   Chief Complaint  Patient presents with  . Abdominal Pain    Lower abd pain, urinary symptoms, and vaginal discharge x 6 weeks. Was seen by PCP and treated for UTI but not improving. Reports hx of kidney stones   HPI  History of Present Illness This is a patient with a history of urinary tract infections presenting with severe lower abdominal pain.  The patient reports experiencing severe lower abdominal pain that began with a urinary tract infection (UTI) in 05/2024. The pain initially improved with antibiotics but has since returned. She also mentions a slight vaginal discharge, which she attributes to antibiotic use. She believes her current symptoms are related to her kidneys, as she has a history of urinary issues. She was treated with antibiotics at a walk-in clinic for a UTI in 05/2024. Initially, she was given one medication, but after a culture was sent and results were received, she was prescribed a different medication. The second medication provided some relief from the pain, but it did not completely eliminate the odor or other issues. She is not currently on any antibiotics. The patient reports that the pain returned today and is described as massive. She also mentions difficulty urinating and persistent odor. The patient sought medical care today due to the severity of the pain and her need to start a new job.  PAST SURGICAL HISTORY: Hysterectomy C-section Kidney stone removal Appendectomy      Past Medical History:  Diagnosis Date  . Anxiety   . Chest pain 08/20/2015  . Essential hypertension 05/03/2024  . Essential hypertension 05/03/2024  . Hyperglycemia 11/02/2020  . Insomnia   . Irregular heart beat   . Kidney stone   . Pneumothorax   . Radius/ulna fracture    Right  . Scoliosis   . Seasonal allergies     Past  Surgical History:  Procedure Laterality Date  . Cesarean section    . Ectopic pregnancy surgery    . Hysterectomy  04/15/2019  . Pelvic laparoscopy    . Picc line  07/2015  . Pleural scarification    . Ureteral stent placement  08/18/2015    Social History   Substance and Sexual Activity  Alcohol Use No   Tobacco Use History[1] E-Cigarettes  . Vaping Use Never User   . Start Date    . Cartridges/Day    . Quit Date     Social History   Substance and Sexual Activity  Drug Use No   Comment: once a week         Allergies[2]  Home Medications   ALBUTEROL  (ACCUNEB ) 1.25 MG/3ML NEBULIZER SOLUTION    Take 3 mLs (1.25 mg dose) by nebulization every 6 (six) hours as needed for Wheezing.   ALBUTEROL  SULFATE, SENSOR, (PROAIR  DIGIHALER) 108 (90 BASE) MCG/ACT AEPB    Inhale 1 puff into the lungs every 4 (four) hours.   BACLOFEN (LIORESAL) 10 MG TABLET    Take one tablet (10 mg dose) by mouth 2 (two) times a day as needed.   BUDESONIDE-FORMOTEROL (SYMBICORT) 160-4.5 MCG/ACTUATION INHALER    Inhale two puffs into the lungs 2 (two) times daily.   DEXAMETHASONE (DECADRON) 2 MG TABLET    10mg  (5pills) PRN for bee sting   DEXLANSOPRAZOLE (DEXILANT,KAPIDEX) 30 MG CAPSULE    Take one capsule (30 mg dose) by mouth 2 (two) times daily.  DICLOFENAC SODIUM (VOLTAREN) 1 % GEL    Apply 2 gm to the affected joint 4 times daily as needed.   EPINEPHRINE (EPIPEN 2-PAK) 0.3 MG/0.3 ML INJECTION    Inject 0.3 mLs (0.3 mg dose) into the muscle once as needed for Anaphylaxis for up to 1 dose.   FEXOFENADINE  (ALLEGRA ) 180 MG TABLET    Take one tablet (180 mg dose) by mouth daily.   IBUPROFEN (ADVIL,MOTRIN) 600 MG TABLET    Take one tablet (600 mg dose) by mouth every 6 (six) hours as needed for Pain for up to 30 days.   IPRATROPIUM-ALBUTEROL  (DUONEB) 0.5-2.5 MG/3 ML ML SOLN NEBULIZER SOLUTION    Take 3 mLs by nebulization every 4 (four) hours as needed (wheezing).   LISDEXAMFETAMINE (VYVANSE) 40 MG CAPSULE     Take one capsule (40 mg dose) by mouth every morning. Max Daily Amount: 40 mg   LOSARTAN POTASSIUM (COZAAR) 50 MG TABLET    Take one tablet (50 mg dose) by mouth daily. For BP   MISC. DEVICES MISC    Please provide nebulizer with tubing   MISC. DEVICES MISC    Home blood pressure monitor.  Use as directed.   ONDANSETRON (ZOFRAN) 4 MG TABLET    Take 1-2 tablets by mouth, every 8 hours, as needed for nausea or vomiting. Start with 1 tablet (4mg ) and if symptoms are not improved after 30 minutes, may take a second tablet. Do not exceed 6 tablets (24mg ) in one day.   RIZATRIPTAN (MAXALT) 5 MG TABLET    Take one tablet (5 mg dose) by mouth once as needed for Migraine for up to 1 dose. May repeat in 2 hours if needed   VENTOLIN  HFA 108 (90 BASE) MCG/ACT INHALER    Inhale two puffs into the lungs every 6 (six) hours as needed for Wheezing or Shortness of Breath (coughing spells).   VENTOLIN  HFA 108 (90 BASE) MCG/ACT INHALER    Inhale two puffs into the lungs every 6 (six) hours as needed for Wheezing or Shortness of Breath (coughing spells).    Primary Survey  Primary Survey  Review of Systems   Review of Systems  Physical Exam   ED Triage Vitals [07/22/24 1059]  BP (!) 157/110  Heart Rate 116  Resp 22  SpO2 100 %  Temp 98 F (36.7 C)    Physical Exam  Physical Exam Gastrointestinal: Abdomen is tender to palpation in the lower quadrants. Pain is elicited upon palpation of the lower abdomen.    ED Course   Results    Lab results:   CBC AND DIFFERENTIAL - Abnormal      Result Value   WBC 6.2     RBC 4.65     HGB 13.9     HCT 42.4     MCV 91.2     MCH 29.9     MCHC 32.8     Plt Ct 320     RDW SD 43.1     MPV 9.2 (*)    NRBC% 0.0     Absolute NRBC Count 0.00     NEUTROPHIL % 63.8     LYMPHOCYTE % 24.5     MONOCYTE % 9.3     Eosinophil % 1.1     BASOPHIL % 1.0     IG% 0.3     ABSOLUTE NEUTROPHIL COUNT 3.98     ABSOLUTE LYMPHOCYTE COUNT 1.53     Absolute Monocyte  Count 0.58  Absolute Eosinophil Count 0.07     Absolute Basophil Count 0.06     Absolute Immature Granulocyte Count 0.02    COMPREHENSIVE METABOLIC PANEL - Abnormal   Na 137     Potassium 4.2     Cl 102     CO2 23     AGAP 12     Glucose 112 (*)    BUN 12     Creatinine 0.80     Ca 10.2     ALK PHOS 87     T Bili 0.7     Total Protein 7.1     Alb 4.4     GLOBULIN 2.7     ALBUMIN/GLOBULIN RATIO 1.6     BUN/CREAT RATIO 15.0     ALT 19     AST 18     eGFR 97     Comment: Normal GFR (glomerular filtration rate) > 60 mL/min/1.73 meters squared, < 60 may include impaired kidney function. Calculation based on the Chronic Kidney Disease Epidemiology Collaboration (CK-EPI)equation refit without adjustment for race.  URINALYSIS W/MICRO REFLEX CULTURE - SYMPTOMATIC - Abnormal   Urine Color Yellow     Urine Clarity Clear     Urine Specific Gravity 1.011     Urine pH 7.0     Urine Protein - Dipstick Negative     Urine Glucose Negative     Urine Ketones Negative     Urine Bilirubin Negative     Urine Blood Negative     Urine Urobilinogen <2     Urine Nitrite Positive (*)    Urine Leukocyte Esterase 25 (*)    Urine Squamous Epithelial Cells 5 (*)    Urine WBC 6 (*)    Urine RBC 1     Urine Bacteria Many (*)    Urine Mucous Moderate (*)    Urine Renal Epithelial Cells <1 (*)    UA Microscopic Yes Micro (*)    Narrative:    Does not meet criteria for reflex to Urine Culture.  CHLAMYDIA TRACHOMATIS/NEISSERIA GONORRHOEAE PCR  LIGHT BLUE TOP  GOLD SST    Imaging:   CT ABDOMEN PELVIS W IV CONTRAST   Narrative:    BODY CT ABDOMEN/PELVIS WITH INTRAVENOUS CONTRAST  TECHNIQUE: Following the intravenous administration of  100 mL of IOPAMIDOL 76 % IV SOLN, a  helical CT scan of the abdomen and pelvis was obtained from the lung bases through the pubic symphysis. Images were reconstructed in the axial plane. Coronal and sagittal reformatted images were also provided for further  evaluation.  PROVIDED CLINICAL INDICATION: Abdominal Pain ADDITIONAL CLINICAL INDICATION: None available   COMPARISON: 10/30/2023 and earlier examinations  FINDINGS:  LOWER CHEST: Unremarkable.  ABDOMEN AND PELVIS:  LIVER: Normal liver contour.  No focal liver lesions.  BILIARY: The gallbladder is normal in appearance. No biliary ductal dilatation.  SPLEEN: No splenomegaly.  PANCREAS: No focal masses or ductal dilatation.   ADRENALS: No adrenal nodules. KIDNEYS/URETERS: Kidneys enhance symmetrically without hydronephrosis. No solid renal masses. Bilateral renal cysts. BLADDER:  Stranding and thickening noted along the dome of the bladder and extending along  the area of expected urachus with mural thickening and surrounding inflammatory changes (2:112, 6:52). PELVIC/REPRODUCTIVE ORGANS: Status post hysterectomy. 3.1 cm right renal cyst. Unremarkable left ovary.  GI TRACT: No dilated or thick walled loops of bowel. Normal appendix.  PERITONEUM/RETROPERITONEUM AND MESENTERY: No free air or fluid. LYMPH NODES: No enlarged lymph nodes in the abdomen or pelvis.  VESSELS: The  aorta is normal in caliber. No significant calcified atherosclerotic disease. The portal venous system is patent. The hepatic veins and IVC are unremarkable.  BONES AND SOFT TISSUES: Small, fat-containing, umbilical hernia. No aggressive osseous lesions.     Impression:    IMPRESSION:  Thickening and inflammation involving the urachus and focally involving the adjacent bladder dome, possibly infectious/inflammatory, less likely neoplastic. Recommend urological consultation.  Electronically Signed by: Woodie Pugh, MD on 07/22/2024 12:53 PM    ECG: ECG Results   None                                                               Pre-Sedation Procedures  ED Course as of 07/22/24 1347  [facility] Documentation  Mon Jul 22, 2024  1158 White blood cell count is  normal making severe sepsis unlikely.  1158 Renal function benign.  1159 Urinalysis has nitrate positivity making urinary tract infection likely   Medical Decision Making Amount and/or Complexity of Data Reviewed Labs: ordered. Radiology: ordered.  Risk Prescription drug management.    Assessment & Plan Initial Assessment: Abdominal pain likely due to a urinary tract infection (UTI) and urachus.  Differential Diagnosis: - Kidney stones: History of kidney stones, CT scan to rule out. - Appendicitis: Considered due to abdominal pain, CT scan to rule out.  ED Course: - Pain medication administered. - Urine test performed. - CT scan conducted.  Final Assessment: Abdominal pain likely due to UTI and urachus. Pain medication given, urine test and CT scan performed to rule out kidney stones and appendicitis.  Clinical Impression: - Urinary tract infection (UTI) - Urachus inflammation  Disposition: - Discharge: Home, pain medication and antibiotics prescribed, follow-up with urologist. - Follow-Up: Referral to urologist for further evaluation and potential removal of urachus.  In reviewing the patient's old records, I reviewed their prior controlled substances prescriptions, using the PDMP.    Provider Communication  New Prescriptions   CEPHALEXIN (KEFLEX) 500 MG CAPSULE    Take one capsule (500 mg dose) by mouth 3 (three) times a day for 7 days.      Quantity: 21 capsule    Refills: 0   HYDROCODONE-ACETAMINOPHEN (NORCO) 5-325 MG PER TABLET    Take one tablet by mouth every 6 (six) hours as needed (severe pain) for up to 10 doses. Max Daily Amount: 4 tablets      Quantity: 10 tablet    Refills: 0   IBUPROFEN (ADVIL,MOTRIN) 400 MG TABLET    Take one tablet (400 mg dose) by mouth every 6 (six) hours as needed for Pain or Fever for up to 20 doses.      Quantity: 20 tablet    Refills: 0    Modified Medications   No medications on file    Discontinued Medications   No  medications on file    Clinical Impression Final diagnoses:  Acute UTI  Lower abdominal pain  Elevated blood-pressure reading without diagnosis of hypertension  Infection of urachal remnant    ED Disposition     ED Disposition  Discharge   Condition  Stable   Comment  --                 Follow-up Information     Greig FORBES Blush, MD. Schedule  an appointment as soon as possible for a visit in 3 days.   Specialty: Internal Medicine Contact information: [address] [place] [address] [phone]         Maude PARAS Domenig, DO. Schedule an appointment as soon as possible for a visit in 3 days.   Specialty: Urology Contact information: [address] [place] [address] [phone]                  Electronically signed by:       [1] Social History Tobacco Use  Smoking Status Former  . Current packs/day: 0.00  . Average packs/day: 0.3 packs/day for 7.0 years (1.8 ttl pk-yrs)  . Types: Cigarettes  . Start date: 05/20/2007  . Quit date: 05/19/2014  . Years since quitting: 10.1  Smokeless Tobacco Never  Tobacco Comments   One cigarettes  [2] Allergies Allergen Reactions  . Hydroxyzine Chest Pain    Pt states she cannot take this medication with her other medications   . Hydrochlorothiazide Rash  . Topamax Other    Numbness,tingling in hands  . Azithromycin     Stomach cramps  . Pantoprazole Diarrhea  . Protonix Diarrhea  . Strattera [Atomoxetine] Nausea Only    Stomach cramps   Meribeth Faustine Felix, MD 07/22/24 [phone]

## 2024-08-02 ENCOUNTER — Telehealth: Payer: Self-pay | Admitting: Urology

## 2024-08-02 NOTE — Telephone Encounter (Signed)
 Charlene Hill

## 2024-08-04 NOTE — Progress Notes (Unsigned)
 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  She did grow an E. coli.  She states that she has had 5-6 UTIs treated over the past 6 months.  Most recently, she has had persistent discomfort during and after treatment with antibiotics.  She does complain about pain with urination at the present time.  She denies any gross hematuria.  She has had no febrile UTIs.   Past Medical History:  Past Medical History:  Diagnosis Date   ADD (attention deficit disorder)    Anxiety     Past Surgical History:  No past surgical history on file.  Allergies:  Allergies  Allergen Reactions   Hydroxyzine Other (See Comments)    Pt states she cannot take this medication with her other medications  Pt states she cannot take this medication with her other medications     Hydrochlorothiazide Rash   Topiramate Other (See Comments)    Numbness,tingling in hands Numbness,tingling in hands Numbness,tingling in hands    Azithromycin Other (See Comments)    Stomach cramps Stomach cramps Stomach cramps    Atomoxetine Nausea Only    Stomach cramps   Other Diarrhea   Pantoprazole Diarrhea    Family History:  Family History  Problem Relation Age of Onset   Diabetes Mother    Hypertension Mother    Heart failure Mother     Social History:  Social History   Tobacco Use   Smoking status: Never   Smokeless tobacco: Never  Substance Use Topics   Alcohol use: Not Currently   Drug use: Never    Review of symptoms:  Constitutional:  Negative for unexplained weight loss, night sweats, fever, chills ENT:  Negative for nose bleeds, sinus pain, painful swallowing CV:  Negative for chest pain, shortness of breath,  exercise intolerance, palpitations, loss of consciousness Resp:  Negative for cough, wheezing, shortness of breath GI:  Negative for nausea, vomiting, diarrhea, bloody stools GU:  Positives noted in HPI; otherwise negative for gross hematuria, dysuria, urinary incontinence Neuro:  Negative for seizures, poor balance, limb weakness, slurred speech Psych:  Negative for lack of energy, depression, anxiety Endocrine:  Negative for polydipsia, polyuria, symptoms of hypoglycemia (dizziness, hunger, sweating) Hematologic:  Negative for anemia, purpura, petechia, prolonged or excessive bleeding, use of anticoagulants  Allergic:  Negative for difficulty breathing or choking as a result of exposure to anything; no shellfish allergy; no allergic response (rash/itch) to materials, foods  Physical exam: There were no vitals taken for this visit. GENERAL APPEARANCE:  Well appearing, well developed, well nourished, NAD HEENT: Atraumatic, Normocephalic. NECK: Normal appearance LUNGS: Normal inspiratory and expiratory excursion HEART: Regular Rate ABDOMEN: Minimal lower abdominal tenderness.  No mass, no megaly. EXTREMITIES: Moves all extremities well.  Without clubbing, cyanosis, or edema. NEUROLOGIC:  Alert and oriented x 3, normal gait, CN II-XII grossly intact.  MENTAL STATUS:  Appropriate. SKIN:  Warm, dry and intact.    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  These very rarely are associated with neoplasm  -As there was inflammation on the CT scan at the  time of her UTI, I think it would be worthwhile repeating CT scan and bringing her back in for cystoscopy  -Until the above is completed, I will keep her on nitrofurantoin  prophylaxis  -I also discussed the fact that it may be worthwhile for her to eventually see a robotic surgeon to discuss excision of the urachus

## 2024-08-05 ENCOUNTER — Encounter: Payer: Self-pay | Admitting: Urology

## 2024-08-05 ENCOUNTER — Ambulatory Visit (INDEPENDENT_AMBULATORY_CARE_PROVIDER_SITE_OTHER): Admitting: Urology

## 2024-08-05 VITALS — BP 127/90 | HR 89 | Ht 63.0 in | Wt 130.0 lb

## 2024-08-05 DIAGNOSIS — Z87440 Personal history of urinary (tract) infections: Secondary | ICD-10-CM

## 2024-08-05 DIAGNOSIS — R300 Dysuria: Secondary | ICD-10-CM | POA: Diagnosis not present

## 2024-08-05 DIAGNOSIS — A499 Bacterial infection, unspecified: Secondary | ICD-10-CM

## 2024-08-05 DIAGNOSIS — Q644 Malformation of urachus: Secondary | ICD-10-CM | POA: Diagnosis not present

## 2024-08-05 DIAGNOSIS — N390 Urinary tract infection, site not specified: Secondary | ICD-10-CM

## 2024-08-05 LAB — URINALYSIS, ROUTINE W REFLEX MICROSCOPIC
Bilirubin, UA: NEGATIVE
Glucose, UA: NEGATIVE
Ketones, UA: NEGATIVE
Leukocytes,UA: NEGATIVE
Nitrite, UA: NEGATIVE
Protein,UA: NEGATIVE
RBC, UA: NEGATIVE
Specific Gravity, UA: 1.02 (ref 1.005–1.030)
Urobilinogen, Ur: 1 mg/dL (ref 0.2–1.0)
pH, UA: 6.5 (ref 5.0–7.5)

## 2024-08-05 LAB — BLADDER SCAN AMB NON-IMAGING: Scan Result: 2

## 2024-08-05 MED ORDER — NITROFURANTOIN MONOHYD MACRO 100 MG PO CAPS: ORAL_CAPSULE | ORAL | 0 refills | Status: AC

## 2024-08-07 ENCOUNTER — Other Ambulatory Visit (HOSPITAL_BASED_OUTPATIENT_CLINIC_OR_DEPARTMENT_OTHER): Payer: Self-pay | Admitting: Urology

## 2024-08-07 ENCOUNTER — Inpatient Hospital Stay
Admission: RE | Admit: 2024-08-07 | Discharge: 2024-08-07 | Disposition: A | Discharge status: Home / Self Care | Payer: Self-pay | Source: Ambulatory Visit | Attending: Urology | Admitting: Urology

## 2024-08-07 DIAGNOSIS — Q644 Malformation of urachus: Secondary | ICD-10-CM

## 2024-08-08 ENCOUNTER — Telehealth: Payer: Self-pay | Admitting: Urology

## 2024-08-08 NOTE — Telephone Encounter (Signed)
-----   Message from Garnette HERO Dahlstedt sent at 08/07/2024 12:59 PM EDT ----- Please call patient.  She brought a disc with her CT scan images on it to her visit on Monday.  I have taken the disc and that had the radiology people put the images in the PACS system.  She can come by and pick up the disc at any point if she wants.

## 2024-08-08 NOTE — Telephone Encounter (Signed)
 Called the patient and she will be coming in sometime to pick it up
